# Patient Record
Sex: Female | Born: 1955 | Race: White | Hispanic: No | Marital: Single | Smoking: Never smoker
Health system: Southern US, Community
[De-identification: ages and names within clinical notes are randomized; demographics above are authoritative.]

## PROBLEM LIST (undated history)

## (undated) DIAGNOSIS — I639 Cerebral infarction, unspecified: Secondary | ICD-10-CM

## (undated) DIAGNOSIS — M549 Dorsalgia, unspecified: Secondary | ICD-10-CM

## (undated) HISTORY — DX: Cerebral infarction, unspecified: I63.9

## (undated) HISTORY — DX: Dorsalgia, unspecified: M54.9

---

## 2021-07-29 ENCOUNTER — Emergency Department (HOSPITAL_COMMUNITY)
Admission: EM | Admit: 2021-07-29 | Discharge: 2021-07-29 | Disposition: A | Discharge status: Home / Self Care | Payer: 59 | Attending: Student | Admitting: Student

## 2021-07-29 ENCOUNTER — Other Ambulatory Visit: Payer: Self-pay

## 2021-07-29 DIAGNOSIS — Z5321 Procedure and treatment not carried out due to patient leaving prior to being seen by health care provider: Secondary | ICD-10-CM | POA: Diagnosis not present

## 2021-07-29 DIAGNOSIS — R202 Paresthesia of skin: Secondary | ICD-10-CM | POA: Diagnosis present

## 2021-07-29 DIAGNOSIS — R200 Anesthesia of skin: Secondary | ICD-10-CM | POA: Insufficient documentation

## 2021-07-29 NOTE — ED Triage Notes (Signed)
Patient bib [facility] , went out for stroke like symptoms, stroke screen negative, ems says patient has tingling/numbness in back of neck going down left side. Patient reports she thinks she has a brain tumor, says her head feels heavy. Symptoms x 2 weeks

## 2021-07-30 ENCOUNTER — Ambulatory Visit
Admission: RE | Admit: 2021-07-30 | Discharge: 2021-07-30 | Disposition: A | Discharge status: Home / Self Care | Payer: 59 | Source: Ambulatory Visit | Attending: Family Medicine | Admitting: Family Medicine

## 2021-07-30 ENCOUNTER — Other Ambulatory Visit: Payer: Self-pay | Admitting: Family Medicine

## 2021-07-30 DIAGNOSIS — R200 Anesthesia of skin: Secondary | ICD-10-CM

## 2021-07-30 DIAGNOSIS — R202 Paresthesia of skin: Secondary | ICD-10-CM

## 2022-06-26 DIAGNOSIS — E1169 Type 2 diabetes mellitus with other specified complication: Secondary | ICD-10-CM | POA: Diagnosis not present

## 2022-06-26 DIAGNOSIS — G629 Polyneuropathy, unspecified: Secondary | ICD-10-CM | POA: Diagnosis not present

## 2022-06-26 DIAGNOSIS — E782 Mixed hyperlipidemia: Secondary | ICD-10-CM | POA: Diagnosis not present

## 2022-06-26 DIAGNOSIS — I10 Essential (primary) hypertension: Secondary | ICD-10-CM | POA: Diagnosis not present

## 2022-07-17 DIAGNOSIS — E782 Mixed hyperlipidemia: Secondary | ICD-10-CM | POA: Diagnosis not present

## 2022-07-17 DIAGNOSIS — G629 Polyneuropathy, unspecified: Secondary | ICD-10-CM | POA: Diagnosis not present

## 2022-07-17 DIAGNOSIS — I10 Essential (primary) hypertension: Secondary | ICD-10-CM | POA: Diagnosis not present

## 2022-07-17 DIAGNOSIS — E1169 Type 2 diabetes mellitus with other specified complication: Secondary | ICD-10-CM | POA: Diagnosis not present

## 2022-07-17 DIAGNOSIS — R5383 Other fatigue: Secondary | ICD-10-CM | POA: Diagnosis not present

## 2022-07-24 ENCOUNTER — Telehealth: Payer: Self-pay | Admitting: Hematology

## 2022-07-24 DIAGNOSIS — E782 Mixed hyperlipidemia: Secondary | ICD-10-CM | POA: Diagnosis not present

## 2022-07-24 DIAGNOSIS — M898X9 Other specified disorders of bone, unspecified site: Secondary | ICD-10-CM | POA: Diagnosis not present

## 2022-07-24 DIAGNOSIS — G629 Polyneuropathy, unspecified: Secondary | ICD-10-CM | POA: Diagnosis not present

## 2022-07-24 DIAGNOSIS — E1169 Type 2 diabetes mellitus with other specified complication: Secondary | ICD-10-CM | POA: Diagnosis not present

## 2022-07-24 DIAGNOSIS — D72819 Decreased white blood cell count, unspecified: Secondary | ICD-10-CM | POA: Diagnosis not present

## 2022-07-24 DIAGNOSIS — M549 Dorsalgia, unspecified: Secondary | ICD-10-CM | POA: Diagnosis not present

## 2022-07-24 DIAGNOSIS — I10 Essential (primary) hypertension: Secondary | ICD-10-CM | POA: Diagnosis not present

## 2022-07-24 NOTE — Telephone Encounter (Signed)
Scheduled appointment per 11/01. Patient is aware of appointment date and time. Patient is aware to arrive 15 mins prior to appointment time and to bring updated insurance cards. Patient is aware of location.   

## 2022-07-31 ENCOUNTER — Inpatient Hospital Stay: Payer: Medicare HMO | Attending: Hematology | Admitting: Hematology

## 2022-07-31 ENCOUNTER — Other Ambulatory Visit: Payer: Self-pay

## 2022-07-31 ENCOUNTER — Inpatient Hospital Stay: Payer: Medicare HMO

## 2022-07-31 VITALS — BP 105/65 | HR 69 | Temp 97.7°F | Resp 16 | Wt 160.0 lb

## 2022-07-31 DIAGNOSIS — M549 Dorsalgia, unspecified: Secondary | ICD-10-CM | POA: Diagnosis not present

## 2022-07-31 DIAGNOSIS — D72819 Decreased white blood cell count, unspecified: Secondary | ICD-10-CM | POA: Diagnosis not present

## 2022-07-31 DIAGNOSIS — G629 Polyneuropathy, unspecified: Secondary | ICD-10-CM | POA: Insufficient documentation

## 2022-07-31 DIAGNOSIS — D472 Monoclonal gammopathy: Secondary | ICD-10-CM | POA: Diagnosis not present

## 2022-07-31 DIAGNOSIS — E119 Type 2 diabetes mellitus without complications: Secondary | ICD-10-CM | POA: Diagnosis not present

## 2022-07-31 LAB — CBC WITH DIFFERENTIAL (CANCER CENTER ONLY)
Abs Immature Granulocytes: 0.01 10*3/uL (ref 0.00–0.07)
Basophils Absolute: 0 10*3/uL (ref 0.0–0.1)
Basophils Relative: 0 %
Eosinophils Absolute: 0.3 10*3/uL (ref 0.0–0.5)
Eosinophils Relative: 4 %
HCT: 37.6 % (ref 36.0–46.0)
Hemoglobin: 12.5 g/dL (ref 12.0–15.0)
Immature Granulocytes: 0 %
Lymphocytes Relative: 27 %
Lymphs Abs: 1.9 10*3/uL (ref 0.7–4.0)
MCH: 31.5 pg (ref 26.0–34.0)
MCHC: 33.2 g/dL (ref 30.0–36.0)
MCV: 94.7 fL (ref 80.0–100.0)
Monocytes Absolute: 0.5 10*3/uL (ref 0.1–1.0)
Monocytes Relative: 7 %
Neutro Abs: 4.4 10*3/uL (ref 1.7–7.7)
Neutrophils Relative %: 62 %
Platelet Count: 174 10*3/uL (ref 150–400)
RBC: 3.97 MIL/uL (ref 3.87–5.11)
RDW: 12.7 % (ref 11.5–15.5)
WBC Count: 7 10*3/uL (ref 4.0–10.5)
nRBC: 0 % (ref 0.0–0.2)

## 2022-07-31 LAB — CMP (CANCER CENTER ONLY)
ALT: 33 U/L (ref 0–44)
AST: 26 U/L (ref 15–41)
Albumin: 3.8 g/dL (ref 3.5–5.0)
Alkaline Phosphatase: 47 U/L (ref 38–126)
Anion gap: 6 (ref 5–15)
BUN: 27 mg/dL — ABNORMAL HIGH (ref 8–23)
CO2: 29 mmol/L (ref 22–32)
Calcium: 9 mg/dL (ref 8.9–10.3)
Chloride: 106 mmol/L (ref 98–111)
Creatinine: 1.07 mg/dL — ABNORMAL HIGH (ref 0.44–1.00)
GFR, Estimated: 58 mL/min — ABNORMAL LOW (ref >60)
Glucose, Bld: 155 mg/dL — ABNORMAL HIGH (ref 70–99)
Potassium: 4.3 mmol/L (ref 3.5–5.1)
Sodium: 141 mmol/L (ref 135–145)
Total Bilirubin: 0.6 mg/dL (ref 0.3–1.2)
Total Protein: 6.2 g/dL — ABNORMAL LOW (ref 6.5–8.1)

## 2022-07-31 LAB — LACTATE DEHYDROGENASE: LDH: 192 U/L (ref 98–192)

## 2022-07-31 LAB — TECHNOLOGIST SMEAR REVIEW: Plt Morphology: NORMAL

## 2022-07-31 LAB — VITAMIN B12: Vitamin B-12: 1351 pg/mL — ABNORMAL HIGH (ref 180–914)

## 2022-07-31 NOTE — Progress Notes (Signed)
HEMATOLOGY/ONCOLOGY CONSULTATION NOTE  Date of Service: 07/31/2022  Patient Care Team: Joya Gaskins, FNP as PCP - General (Family Medicine)  CHIEF COMPLAINTS/PURPOSE OF CONSULTATION:  Leukopenia  HISTORY OF PRESENTING ILLNESS:   Misty Stewart is a wonderful 66 y.o. female who has been referred to [place] by Dr. Emilee Hero, MD, for evaluation and management of Leukopenia. Patient is here with her sister during today's visit.   Her lab results from July showed white count of 6K, and it has not been reduced to 2.4 K over the period of about 4 months.   She notes she was diagnosed with Type II Diabetes in February 2022. She was also overweight at the time she was diagnosed with Type II Diabetes. She notes she has lost around 125 lbs since February 2022 because she changed her diet and without taking any weight loss medication.  She reports she had loss of appetite and leg swelling, where her PCP changed her Lisinopril to 10 mg. She complains of consistent dizziness.   She reports she has neck and left hand/leg numbness and sees her Neurologist for it. She notes her neurologist told her that she had mini strokes. Her Neurologist is Dr. Tommas Olp.  She complains of tingling sensation all over the body, mostly on the right side of the body. She has not seen her Neurologist for it, but has scheduled an appointment with her Neurologist.   She notes she recently started Meloxicam a week ago for her back pain. She reports her back pain started about a year ago. She complains that her back has been stiff. She notes that her back pain radiated to numbness/tingling sensation on her left ear and left head. She saw Neurologist for this symptoms and her Neurologist states this symptoms are due to stroke.   She complains loss of sensation/perception on her left side of her face, which is probably due to her stroke.   She notes that her appetite has been well, but she does not know  when she is full. She also reports mild bladder problems.   Patient reports she has loss of sensation on her feet, which she has been going to neuropathy exercises.   She has been taking Cymbalta for her neuropathy as well. She denies taking Gabapentin. Patient notes she is drinking supplemental drink to help her nerve function. She is also taking vitamin B-12 supplement.   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 Ashby Dawes. Her sister notes that she had an back X-Ray last week, but not bone density.   She denies of infections, fever, chill, recent falls, new lumps/bumps, unexpected weight loss, and abdominal pain during today's visit. She complains of middle back pain which radiates to upper back and shoulder. She also complains of problems with her balance, but she does not know if she has had nerve conduction study.    MEDICAL HISTORY:  HTN, DM2 HLD CVA   SURGICAL HISTORY: No previous Shx  SOCIAL HISTORY: Social History   Socioeconomic History   Marital status: Single    Spouse name: Not on file   Number of children: Not on file   Years of education: Not on file   Highest education level: Not on file  Occupational History   Not on file  Tobacco Use   Smoking status: Not on file   Smokeless tobacco: Not on file  Substance and Sexual Activity   Alcohol use: Not on file   Drug use:  Not on file   Sexual activity: Not on file  Other Topics Concern   Not on file  Social History Narrative   Not on file   Social Determinants of Health   Financial Resource Strain: Not on file  Food Insecurity: Not on file  Transportation Needs: Not on file  Physical Activity: Not on file  Stress: Not on file  Social Connections: Not on file  Intimate Partner Violence: Not on file    FAMILY HISTORY:   ALLERGIES:  has no allergies on file.  MEDICATIONS:    REVIEW OF SYSTEMS:    10 Point review of Systems was done is negative except as noted  above.  PHYSICAL EXAMINATION: ECOG PERFORMANCE STATUS: 1 - Symptomatic but completely ambulatory  . Vitals:   07/31/22 1115  BP: 105/65  Pulse: 69  Resp: 16  Temp: 97.7 F (36.5 C)  SpO2: 98%   Filed Weights   07/31/22 1115  Weight: 160 lb (72.6 kg)   .There is no height or weight on file to calculate BMI.  GENERAL:alert, in no acute distress and comfortable SKIN: no acute rashes, no significant lesions EYES: conjunctiva are pink and non-injected, sclera anicteric OROPHARYNX: MMM, no exudates, no oropharyngeal erythema or ulceration NECK: supple, no JVD LYMPH:  no palpable lymphadenopathy in the cervical, axillary or inguinal regions LUNGS: clear to auscultation b/l with normal respiratory effort HEART: regular rate & rhythm ABDOMEN:  normoactive bowel sounds , non tender, not distended. Extremity: no pedal edema PSYCH: alert & oriented x 3 with fluent speech NEURO: no focal motor/sensory deficits  LABORATORY DATA:  I have reviewed the data as listed      RADIOGRAPHIC STUDIES: I have personally reviewed the radiological images as listed and agreed with the findings in the report. No results found.  ASSESSMENT & PLAN:   #1 Neuropathy  Seeing Neurologist for her Neuropathy. #2 Type II Diabetes  #3 Maybe Leukopenia - waiting for lab results form today (07/31/2022)  PLAN: -Discussed what could cause Neuropathy.  -Discussed the causes of low WBC and discussed information about Leukopenia.  -Recommended B-12 Complex.  -Answered all patient and patient's sister questions about Leukopenia, Neuropathy, and other questions.  -Will have lab done during today's visit.   . Orders Placed This Encounter  Procedures   CBC with Differential ([place] Only)    Standing Status:   Future    Number of Occurrences:   1    Standing Expiration Date:   08/01/2023   CMP ([place] only)    Standing Status:   Future    Number of Occurrences:   1    Standing  Expiration Date:   08/01/2023   Lactate dehydrogenase    Standing Status:   Future    Number of Occurrences:   1    Standing Expiration Date:   08/01/2023   Vitamin B12    Standing Status:   Future    Number of Occurrences:   1    Standing Expiration Date:   07/31/2023   Copper, serum    Standing Status:   Future    Number of Occurrences:   1    Standing Expiration Date:   07/31/2023   Multiple Myeloma Panel (SPEP&IFE w/QIG)    Standing Status:   Future    Number of Occurrences:   1    Standing Expiration Date:   08/01/2023   Kappa/lambda light chains    Standing Status:   Future    Number  of Occurrences:   1    Standing Expiration Date:   08/01/2023   Folate RBC    Standing Status:   Future    Number of Occurrences:   1    Standing Expiration Date:   08/01/2023   Pathologist smear review    Standing Status:   Future    Standing Expiration Date:   08/01/2023   Technologist smear review    FOLLOW-UP: Labs today Phone visit with Dr Irene Limbo in 1 week ..gk . Orders Placed This Encounter  Procedures   CBC with Differential ([place] Only)    Standing Status:   Future    Number of Occurrences:   1    Standing Expiration Date:   08/01/2023   CMP ([place] only)    Standing Status:   Future    Number of Occurrences:   1    Standing Expiration Date:   08/01/2023   Lactate dehydrogenase    Standing Status:   Future    Number of Occurrences:   1    Standing Expiration Date:   08/01/2023   Vitamin B12    Standing Status:   Future    Number of Occurrences:   1    Standing Expiration Date:   07/31/2023   Copper, serum    Standing Status:   Future    Number of Occurrences:   1    Standing Expiration Date:   07/31/2023   Multiple Myeloma Panel (SPEP&IFE w/QIG)    Standing Status:   Future    Number of Occurrences:   1    Standing Expiration Date:   08/01/2023   Kappa/lambda light chains    Standing Status:   Future    Number of Occurrences:   1    Standing Expiration Date:    08/01/2023   Folate RBC    Standing Status:   Future    Number of Occurrences:   1    Standing Expiration Date:   08/01/2023   Pathologist smear review    Standing Status:   Future    Standing Expiration Date:   08/01/2023   Technologist smear review    .The total time spent in the appointment was 45 minutes* .  All of the patient's questions were answered with apparent satisfaction. The patient knows to call the clinic with any problems, questions or concerns.   Sullivan Lone MD MS AAHIVMS [facility] Hematology/Oncology Physician [facility]  .*Total Encounter Time as defined by the Centers for Medicare and Medicaid Services includes, in addition to the face-to-face time of a patient visit (documented in the note above) non-face-to-face time: obtaining and reviewing outside history, ordering and reviewing medications, tests or procedures, care coordination (communications with other health care professionals or caregivers) and documentation in the medical record.    Zettie Cooley, am acting as a scribe for Sullivan Lone, MD.  Sullivan Lone MD [place] AAHIVMS [facility] Hematology/Oncology Physician [facility]  (Office):       [phone] (Work cell):  [phone] (Fax):           [phone]  07/31/2022 10:51 AM

## 2022-08-01 LAB — KAPPA/LAMBDA LIGHT CHAINS
Kappa free light chain: 22.1 mg/L — ABNORMAL HIGH (ref 3.3–19.4)
Kappa, lambda light chain ratio: 1.64 (ref 0.26–1.65)
Lambda free light chains: 13.5 mg/L (ref 5.7–26.3)

## 2022-08-02 LAB — FOLATE RBC
Folate, Hemolysate: 350 ng/mL
Folate, RBC: 919 ng/mL (ref >498)
Hematocrit: 38.1 % (ref 34.0–46.6)

## 2022-08-05 LAB — MULTIPLE MYELOMA PANEL, SERUM
Albumin SerPl Elph-Mcnc: 3.4 g/dL (ref 2.9–4.4)
Albumin/Glob SerPl: 1.5 (ref 0.7–1.7)
Alpha 1: 0.2 g/dL (ref 0.0–0.4)
Alpha2 Glob SerPl Elph-Mcnc: 0.5 g/dL (ref 0.4–1.0)
B-Globulin SerPl Elph-Mcnc: 0.9 g/dL (ref 0.7–1.3)
Gamma Glob SerPl Elph-Mcnc: 0.7 g/dL (ref 0.4–1.8)
Globulin, Total: 2.3 g/dL (ref 2.2–3.9)
IgA: 65 mg/dL — ABNORMAL LOW (ref 87–352)
IgG (Immunoglobin G), Serum: 783 mg/dL (ref 586–1602)
IgM (Immunoglobulin M), Srm: 71 mg/dL (ref 26–217)
Total Protein ELP: 5.7 g/dL — ABNORMAL LOW (ref 6.0–8.5)

## 2022-08-07 ENCOUNTER — Inpatient Hospital Stay (HOSPITAL_BASED_OUTPATIENT_CLINIC_OR_DEPARTMENT_OTHER): Payer: Medicare HMO | Admitting: Hematology

## 2022-08-07 DIAGNOSIS — D72819 Decreased white blood cell count, unspecified: Secondary | ICD-10-CM | POA: Diagnosis not present

## 2022-08-07 DIAGNOSIS — M549 Dorsalgia, unspecified: Secondary | ICD-10-CM | POA: Diagnosis not present

## 2022-08-07 DIAGNOSIS — E119 Type 2 diabetes mellitus without complications: Secondary | ICD-10-CM | POA: Diagnosis not present

## 2022-08-07 DIAGNOSIS — G629 Polyneuropathy, unspecified: Secondary | ICD-10-CM | POA: Diagnosis not present

## 2022-08-07 DIAGNOSIS — D472 Monoclonal gammopathy: Secondary | ICD-10-CM | POA: Diagnosis not present

## 2022-08-07 NOTE — Progress Notes (Signed)
HEMATOLOGY/ONCOLOGY Phone visit NOTE  Date of Service: 08/07/2022  Patient Care Team: Joya Gaskins, FNP as PCP - General (Family Medicine)  CHIEF COMPLAINTS/PURPOSE OF CONSULTATION:  Leukopenia  HISTORY OF PRESENTING ILLNESS:   Misty Stewart is a wonderful 66 y.o. female who has been referred to [place] by Dr. Emilee Hero, MD, for evaluation and management of Leukopenia. Patient is here with her sister during today's visit.   Her lab results from July showed white count of 6K, and it has not been reduced to 2.4 K over the period of about 4 months.   She notes she was diagnosed with Type II Diabetes in February 2022. She was also overweight at the time she was diagnosed with Type II Diabetes. She notes she has lost around 125 lbs since February 2022 because she changed her diet and without taking any weight loss medication.  She reports she had loss of appetite and leg swelling, where her PCP changed her Lisinopril to 10 mg. She complains of consistent dizziness.   She reports she has neck and left hand/leg numbness and sees her Neurologist for it. She notes her neurologist told her that she had mini strokes. Her Neurologist is Dr. Tommas Olp.  She complains of tingling sensation all over the body, mostly on the right side of the body. She has not seen her Neurologist for it, but has scheduled an appointment with her Neurologist.   She notes she recently started Meloxicam a week ago for her back pain. She reports her back pain started about a year ago. She complains that her back has been stiff. She notes that her back pain radiated to numbness/tingling sensation on her left ear and left head. She saw Neurologist for this symptoms and her Neurologist states this symptoms are due to stroke.   She complains loss of sensation/perception on her left side of her face, which is probably due to her stroke.   She notes that her appetite has been well, but she does not know  when she is full. She also reports mild bladder problems.   Patient reports she has loss of sensation on her feet, which she has been going to neuropathy exercises.   She has been taking Cymbalta for her neuropathy as well. She denies taking Gabapentin. Patient notes she is drinking supplemental drink to help her nerve function. She is also taking vitamin B-12 supplement.   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 Ashby Dawes. Her sister notes that she had an back X-Ray last week, but not bone density.   She denies of infections, fever, chill, recent falls, new lumps/bumps, unexpected weight loss, and abdominal pain during today's visit. She complains of middle back pain which radiates to upper back and shoulder. She also complains of problems with her balance, but she does not know if she has had nerve conduction study.   Interval History:  Misty Stewart is a wonderful 66 y.o. female who is here for continued evaluation and management of Leukopenia.  .I connected with Misty Stewart on 08/07/2022 at  8:40 AM EST by telephone visit and verified that I am speaking with the correct person using two identifiers.   Patient was last seen by me on 07/31/2022 and complained of nerve problems and middle back pain that radiated to her upper back and shoulder.   Patient notes she is doing well since our last visit. We discussed her lab results from 07/31/2022. Her WBC, Hemoglobin, LDH, and  Platelets counts are in the normal range.   I discussed the limitations, risks, security and privacy concerns of performing an evaluation and management service by telemedicine and the availability of in-person appointments. I also discussed with the patient that there may be a patient responsible charge related to this service. The patient expressed understanding and agreed to proceed.   Other persons participating in the visit and their role in the encounter: None   Patient's location: Home   Provider's location: [facility]   Chief Complaint: Leukopenia    MEDICAL HISTORY:  HTN, DM2 HLD CVA   SURGICAL HISTORY: No previous Shx  SOCIAL HISTORY: Social History   Socioeconomic History   Marital status: Single    Spouse name: Not on file   Number of children: Not on file   Years of education: Not on file   Highest education level: Not on file  Occupational History   Not on file  Tobacco Use   Smoking status: Not on file   Smokeless tobacco: Not on file  Substance and Sexual Activity   Alcohol use: Not on file   Drug use: Not on file   Sexual activity: Not on file  Other Topics Concern   Not on file  Social History Narrative   Not on file   Social Determinants of Health   Financial Resource Strain: Not on file  Food Insecurity: Not on file  Transportation Needs: Not on file  Physical Activity: Not on file  Stress: Not on file  Social Connections: Not on file  Intimate Partner Violence: Not on file    FAMILY HISTORY:   ALLERGIES:  has no allergies on file.  MEDICATIONS:    REVIEW OF SYSTEMS:    10 Point review of Systems was done is negative except as noted above.  PHYSICAL EXAMINATION: ECOG PERFORMANCE STATUS: 1 - Symptomatic but completely ambulatory  . There were no vitals filed for this visit.  There were no vitals filed for this visit.  .There is no height or weight on file to calculate BMI.  GENERAL:alert, in no acute distress and comfortable SKIN: no acute rashes, no significant lesions EYES: conjunctiva are pink and non-injected, sclera anicteric OROPHARYNX: MMM, no exudates, no oropharyngeal erythema or ulceration NECK: supple, no JVD LYMPH:  no palpable lymphadenopathy in the cervical, axillary or inguinal regions LUNGS: clear to auscultation b/l with normal respiratory effort HEART: regular rate & rhythm ABDOMEN:  normoactive bowel sounds , non tender, not distended. Extremity: no pedal edema PSYCH: alert & oriented x 3 with  fluent speech NEURO: no focal motor/sensory deficits  LABORATORY DATA:  I have reviewed the data as listed      RADIOGRAPHIC STUDIES: I have personally reviewed the radiological images as listed and agreed with the findings in the report. No results found.  ASSESSMENT & PLAN:   #1 Neuropathy  Seeing Neurologist for her Neuropathy. #2 Type II Diabetes  #3 Maybe Leukopenia - waiting for lab results form today (07/31/2022)  PLAN: -Discussed lab results from 07/31/2022 which showed she is not leukopenic. Lab results showed CBC and CMP in the normal range. Her LDH is in the normal range.  -Recommended continue to follow up with PCP. -no additional hematologic w/u recommended at this time. . No orders of the defined types were placed in this encounter.   FOLLOW-UP: RTC with PCP  The total time spent in the appointment was 10 minutes* .  All of the patient's questions were answered with apparent satisfaction. The patient  knows to call the clinic with any problems, questions or concerns.  Zettie Cooley, am acting as a scribe for Sullivan Lone, MD.  Sullivan Lone MD [place] AAHIVMS [facility] Hematology/Oncology Physician [facility]  .*Total Encounter Time as defined by the Centers for Medicare and Medicaid Services includes, in addition to the face-to-face time of a patient visit (documented in the note above) non-face-to-face time: obtaining and reviewing outside history, ordering and reviewing medications, tests or procedures, care coordination (communications with other health care professionals or caregivers) and documentation in the medical record.

## 2022-08-08 DIAGNOSIS — E782 Mixed hyperlipidemia: Secondary | ICD-10-CM | POA: Diagnosis not present

## 2022-08-08 DIAGNOSIS — G629 Polyneuropathy, unspecified: Secondary | ICD-10-CM | POA: Diagnosis not present

## 2022-08-08 DIAGNOSIS — M2550 Pain in unspecified joint: Secondary | ICD-10-CM | POA: Diagnosis not present

## 2022-08-08 DIAGNOSIS — I10 Essential (primary) hypertension: Secondary | ICD-10-CM | POA: Diagnosis not present

## 2022-08-10 LAB — COPPER, SERUM: Copper: 102 ug/dL (ref 80–158)

## 2022-08-14 DIAGNOSIS — R200 Anesthesia of skin: Secondary | ICD-10-CM | POA: Diagnosis not present

## 2022-11-04 DIAGNOSIS — E782 Mixed hyperlipidemia: Secondary | ICD-10-CM | POA: Diagnosis not present

## 2022-11-04 DIAGNOSIS — I10 Essential (primary) hypertension: Secondary | ICD-10-CM | POA: Diagnosis not present

## 2022-11-05 DIAGNOSIS — R200 Anesthesia of skin: Secondary | ICD-10-CM | POA: Diagnosis not present

## 2022-11-08 DIAGNOSIS — E782 Mixed hyperlipidemia: Secondary | ICD-10-CM | POA: Diagnosis not present

## 2022-11-08 DIAGNOSIS — E1169 Type 2 diabetes mellitus with other specified complication: Secondary | ICD-10-CM | POA: Diagnosis not present

## 2022-11-08 DIAGNOSIS — I10 Essential (primary) hypertension: Secondary | ICD-10-CM | POA: Diagnosis not present

## 2022-11-08 DIAGNOSIS — G629 Polyneuropathy, unspecified: Secondary | ICD-10-CM | POA: Diagnosis not present

## 2022-11-19 DIAGNOSIS — Z1330 Encounter for screening examination for mental health and behavioral disorders, unspecified: Secondary | ICD-10-CM | POA: Diagnosis not present

## 2022-11-19 DIAGNOSIS — R202 Paresthesia of skin: Secondary | ICD-10-CM | POA: Diagnosis not present

## 2022-12-03 DIAGNOSIS — M5187 Other intervertebral disc disorders, lumbosacral region: Secondary | ICD-10-CM | POA: Diagnosis not present

## 2022-12-03 DIAGNOSIS — M47817 Spondylosis without myelopathy or radiculopathy, lumbosacral region: Secondary | ICD-10-CM | POA: Diagnosis not present

## 2022-12-03 DIAGNOSIS — M5186 Other intervertebral disc disorders, lumbar region: Secondary | ICD-10-CM | POA: Diagnosis not present

## 2022-12-03 DIAGNOSIS — M47812 Spondylosis without myelopathy or radiculopathy, cervical region: Secondary | ICD-10-CM | POA: Diagnosis not present

## 2022-12-03 DIAGNOSIS — M47816 Spondylosis without myelopathy or radiculopathy, lumbar region: Secondary | ICD-10-CM | POA: Diagnosis not present

## 2022-12-03 DIAGNOSIS — R202 Paresthesia of skin: Secondary | ICD-10-CM | POA: Diagnosis not present

## 2023-02-19 DIAGNOSIS — M25562 Pain in left knee: Secondary | ICD-10-CM | POA: Diagnosis not present

## 2023-02-19 DIAGNOSIS — M25561 Pain in right knee: Secondary | ICD-10-CM | POA: Diagnosis not present

## 2023-02-19 DIAGNOSIS — G2581 Restless legs syndrome: Secondary | ICD-10-CM | POA: Diagnosis not present

## 2023-04-09 DIAGNOSIS — G629 Polyneuropathy, unspecified: Secondary | ICD-10-CM | POA: Diagnosis not present

## 2023-04-09 DIAGNOSIS — E1169 Type 2 diabetes mellitus with other specified complication: Secondary | ICD-10-CM | POA: Diagnosis not present

## 2023-04-09 DIAGNOSIS — I10 Essential (primary) hypertension: Secondary | ICD-10-CM | POA: Diagnosis not present

## 2023-04-09 DIAGNOSIS — E782 Mixed hyperlipidemia: Secondary | ICD-10-CM | POA: Diagnosis not present

## 2023-04-16 DIAGNOSIS — I10 Essential (primary) hypertension: Secondary | ICD-10-CM | POA: Diagnosis not present

## 2023-04-16 DIAGNOSIS — E1142 Type 2 diabetes mellitus with diabetic polyneuropathy: Secondary | ICD-10-CM | POA: Diagnosis not present

## 2023-04-16 DIAGNOSIS — G629 Polyneuropathy, unspecified: Secondary | ICD-10-CM | POA: Diagnosis not present

## 2023-04-16 DIAGNOSIS — E782 Mixed hyperlipidemia: Secondary | ICD-10-CM | POA: Diagnosis not present

## 2023-05-14 DIAGNOSIS — H5213 Myopia, bilateral: Secondary | ICD-10-CM | POA: Diagnosis not present

## 2023-05-29 DIAGNOSIS — R2681 Unsteadiness on feet: Secondary | ICD-10-CM | POA: Diagnosis not present

## 2023-06-06 ENCOUNTER — Emergency Department (HOSPITAL_BASED_OUTPATIENT_CLINIC_OR_DEPARTMENT_OTHER)
Admission: EM | Admit: 2023-06-06 | Discharge: 2023-06-06 | Disposition: A | Discharge status: Home / Self Care | Payer: Medicare HMO | Attending: Emergency Medicine | Admitting: Emergency Medicine

## 2023-06-06 ENCOUNTER — Encounter (HOSPITAL_BASED_OUTPATIENT_CLINIC_OR_DEPARTMENT_OTHER): Payer: Self-pay | Admitting: Emergency Medicine

## 2023-06-06 ENCOUNTER — Emergency Department (HOSPITAL_BASED_OUTPATIENT_CLINIC_OR_DEPARTMENT_OTHER): Payer: Medicare HMO

## 2023-06-06 DIAGNOSIS — Z7982 Long term (current) use of aspirin: Secondary | ICD-10-CM | POA: Insufficient documentation

## 2023-06-06 DIAGNOSIS — R109 Unspecified abdominal pain: Secondary | ICD-10-CM

## 2023-06-06 DIAGNOSIS — R200 Anesthesia of skin: Secondary | ICD-10-CM | POA: Diagnosis not present

## 2023-06-06 DIAGNOSIS — M48061 Spinal stenosis, lumbar region without neurogenic claudication: Secondary | ICD-10-CM | POA: Diagnosis not present

## 2023-06-06 DIAGNOSIS — N200 Calculus of kidney: Secondary | ICD-10-CM | POA: Diagnosis not present

## 2023-06-06 DIAGNOSIS — K449 Diaphragmatic hernia without obstruction or gangrene: Secondary | ICD-10-CM | POA: Diagnosis not present

## 2023-06-06 DIAGNOSIS — M47816 Spondylosis without myelopathy or radiculopathy, lumbar region: Secondary | ICD-10-CM | POA: Diagnosis not present

## 2023-06-06 DIAGNOSIS — M5136 Other intervertebral disc degeneration, lumbar region: Secondary | ICD-10-CM

## 2023-06-06 DIAGNOSIS — R42 Dizziness and giddiness: Secondary | ICD-10-CM | POA: Insufficient documentation

## 2023-06-06 DIAGNOSIS — R1032 Left lower quadrant pain: Secondary | ICD-10-CM | POA: Insufficient documentation

## 2023-06-06 DIAGNOSIS — M5135 Other intervertebral disc degeneration, thoracolumbar region: Secondary | ICD-10-CM | POA: Diagnosis not present

## 2023-06-06 LAB — CBC
HCT: 37.8 % (ref 36.0–46.0)
Hemoglobin: 12.9 g/dL (ref 12.0–15.0)
MCH: 31.2 pg (ref 26.0–34.0)
MCHC: 34.1 g/dL (ref 30.0–36.0)
MCV: 91.3 fL (ref 80.0–100.0)
Platelets: 223 10*3/uL (ref 150–400)
RBC: 4.14 MIL/uL (ref 3.87–5.11)
RDW: 13.2 % (ref 11.5–15.5)
WBC: 8.9 10*3/uL (ref 4.0–10.5)
nRBC: 0 % (ref 0.0–0.2)

## 2023-06-06 LAB — URINALYSIS, ROUTINE W REFLEX MICROSCOPIC
Bilirubin Urine: NEGATIVE
Glucose, UA: NEGATIVE mg/dL
Hgb urine dipstick: NEGATIVE
Ketones, ur: NEGATIVE mg/dL
Leukocytes,Ua: NEGATIVE
Nitrite: NEGATIVE
Protein, ur: NEGATIVE mg/dL
Specific Gravity, Urine: 1.009 (ref 1.005–1.030)
pH: 7 (ref 5.0–8.0)

## 2023-06-06 LAB — COMPREHENSIVE METABOLIC PANEL
ALT: 12 U/L (ref 0–44)
AST: 14 U/L — ABNORMAL LOW (ref 15–41)
Albumin: 3.7 g/dL (ref 3.5–5.0)
Alkaline Phosphatase: 56 U/L (ref 38–126)
Anion gap: 12 (ref 5–15)
BUN: 13 mg/dL (ref 8–23)
CO2: 19 mmol/L — ABNORMAL LOW (ref 22–32)
Calcium: 8.7 mg/dL — ABNORMAL LOW (ref 8.9–10.3)
Chloride: 106 mmol/L (ref 98–111)
Creatinine, Ser: 0.93 mg/dL (ref 0.44–1.00)
GFR, Estimated: >60 mL/min (ref >60)
Glucose, Bld: 93 mg/dL (ref 70–99)
Potassium: 3.7 mmol/L (ref 3.5–5.1)
Sodium: 137 mmol/L (ref 135–145)
Total Bilirubin: 0.9 mg/dL (ref 0.3–1.2)
Total Protein: 6.2 g/dL — ABNORMAL LOW (ref 6.5–8.1)

## 2023-06-06 LAB — LIPASE, BLOOD: Lipase: 21 U/L (ref 11–51)

## 2023-06-06 MED ORDER — IOHEXOL 300 MG/ML  SOLN
100.0000 mL | Freq: Once | INTRAMUSCULAR | Status: AC | PRN
  Administered 2023-06-06: 100 mL via INTRAVENOUS

## 2023-06-06 MED ORDER — TRAMADOL HCL 50 MG PO TABS
50.0000 mg | ORAL_TABLET | Freq: Four times a day (QID) | ORAL | 0 refills | Status: AC | PRN
Start: 2023-06-06 — End: ?

## 2023-06-06 NOTE — ED Triage Notes (Signed)
Left flank pain x 3 day worse today Reports feeling swelling in abdomen

## 2023-06-06 NOTE — Discharge Instructions (Addendum)
It was our pleasure to provide your ER care today - we hope that you feel better.  No intra-abdominal cause of your pain was noted on your imaging studies. Note was made of lumbar degenerative disc disease - it is possible that this, and/or related nerve impingement is contributing to your pain.  Incidental note was made on your imaging of a kidney stone in the right kidney.   Take acetaminophen or ibuprofen as need for pain. You may also take ultram as need for pain - no driving when taking.   Follow up closely with primary care doctor in the coming week.  Return to ER if worse, new symptoms, fevers, severe/intractable pain, numbness/weakness, or other concern.

## 2023-06-06 NOTE — ED Provider Notes (Signed)
[place] [facility]ENC[facility] AT [facility] Provider Note   CSN: [id number] Arrival date & time: 06/06/23  1347     History  Chief Complaint  Patient presents with   Flank Pain    Misty Stewart is a 67 y.o. female.  She has a history of stroke which is left her with some left-sided deficits and gait instability.  She said about 2 weeks ago she had a significant dizzy spell and she has had worsening numbness and worsening gait instability.  It was associated with some nausea and vomiting at that time.  She saw her neurologist about a week ago and they felt she may have had another stroke.  She is waiting on an MRI of her head and her lumbar spine.  For the last 2 days she has had pain in her left flank radiating around to her left lower quadrant.  It is worse with movement.  She denies any fever chills nausea vomiting diarrhea or urinary symptoms.  She is not on any blood thinners.  The history is provided by the patient.  Flank Pain This is a new problem. The current episode started 2 days ago. The problem occurs constantly. The problem has not changed since onset.Associated symptoms include abdominal pain. Pertinent negatives include no chest pain, no headaches and no shortness of breath. The symptoms are aggravated by bending and twisting. Nothing relieves the symptoms. She has tried rest for the symptoms. The treatment provided no relief.       Home Medications Prior to Admission medications   Medication Sig Start Date End Date Taking? Authorizing Provider  aspirin 325 MG tablet 325 mg. 02/05/22   [provider]  cyanocobalamin (VITAMIN B12) 1000 MCG tablet 1,000 mcg daily. 02/05/22   [provider]  CYMBALTA 30 MG capsule Take 30 mg by mouth daily. 06/05/22   [provider]  LIPITOR 40 MG tablet Take 40 mg by mouth daily. 03/19/22   [provider]  lisinopril (ZESTRIL) 10 MG tablet Take 10 mg by mouth daily. 01/04/22   [provider]  meloxicam (MOBIC) 15 MG tablet Take 15 mg by mouth daily. 07/24/22   [provider]      Allergies    Patient has no known allergies.    Review of Systems   Review of Systems  Constitutional:  Negative for fever.  Respiratory:  Negative for shortness of breath.   Cardiovascular:  Negative for chest pain.  Gastrointestinal:  Positive for abdominal pain.  Genitourinary:  Positive for flank pain.  Musculoskeletal:  Positive for back pain and gait problem.  Neurological:  Positive for weakness and numbness. Negative for headaches.    Physical Exam Updated Vital Signs BP 130/80 (BP Location: Right Arm)   Pulse 87   Temp 98.3 F (36.8 C)   Resp 18   SpO2 99%  Physical Exam Vitals and nursing note reviewed.  Constitutional:      General: She is not in acute distress.    Appearance: Normal appearance. She is well-developed.  HENT:     Head: Normocephalic and atraumatic.  Eyes:     Conjunctiva/sclera: Conjunctivae normal.  Cardiovascular:     Rate and Rhythm: Normal rate and regular rhythm.     Heart sounds: No murmur heard. Pulmonary:     Effort: Pulmonary effort is normal. No respiratory distress.     Breath sounds: Normal breath sounds.  Abdominal:     Palpations: Abdomen is soft.  Tenderness: There is no abdominal tenderness. There is no guarding or rebound.  Musculoskeletal:        General: No deformity. Normal range of motion.     Cervical back: Neck supple.  Skin:    General: Skin is warm and dry.     Capillary Refill: Capillary refill takes less than 2 seconds.  Neurological:     Mental Status: She is alert.     Comments: She has no gross facial asymmetry and her upper extremity strength is 5 out of 5.  Lower extremity strength is probably 4 out of 5 bilateral.  She is intact sensation to light touch in her lower extremity.     ED Results / Procedures / Treatments   Labs (all labs ordered are listed, but only abnormal results are  displayed) Labs Reviewed  COMPREHENSIVE METABOLIC PANEL - Abnormal; Notable for the following components:      Result Value   CO2 19 (*)    Calcium 8.7 (*)    Total Protein 6.2 (*)    AST 14 (*)    All other components within normal limits  URINALYSIS, ROUTINE W REFLEX MICROSCOPIC  CBC  LIPASE, BLOOD    EKG None  Radiology No results found.  Procedures Procedures    Medications Ordered in ED Medications  iohexol (OMNIPAQUE) 300 MG/ML solution 100 mL (100 mLs Intravenous Contrast Given 06/06/23 1536)    ED Course/ Medical Decision Making/ A&P                                 Medical Decision Making Amount and/or Complexity of Data Reviewed Labs: ordered. Radiology: ordered.  Risk Prescription drug management.   This patient complains of left flank pain radiating through the left lower abdomen; this involves an extensive number of treatment Options and is a complaint that carries with it a high risk of complications and morbidity. The differential includes renal colic, pyelonephritis, musculoskeletal, retroperitoneal bleed, fracture, diverticulitis  I ordered, reviewed and interpreted labs, which included CBC with normal white count normal hemoglobin, chemistries with low bicarb, urinalysis without signs of infection I ordered imaging studies which included CT head abdomen and pelvis lumbar spine and this is pending at time of signout  additional history obtained from patient's companion Previous records obtained and reviewed in epic including recent neurology notes Cardiac monitoring reviewed, sinus rhythm Social determinants considered, no significant barriers Critical Interventions: None  After the interventions stated above, I reevaluated the patient and found patient is resting comfortably in no distress Admission and further testing considered, her care is signed out to Dr. Denton Lank to follow-up on results of imaging.  Disposition based on results of  imaging.         Final Clinical Impression(s) / ED Diagnoses Final diagnoses:  Left flank pain    Rx / DC Orders ED Discharge Orders     None         Terrilee Files, MD 06/06/23 1702

## 2023-06-06 NOTE — ED Provider Notes (Signed)
Signed out by Dr Charm Barges that ct pending, to d/c to home when imaging  resulted.   Pt c/o is left lower abd/groin area pain. No sts or skin changes/lesions to area of pain. Pain goes from left lower back towards left groin area. No fevers. Vitals normal. Abd soft nt.   CT neg for acute abd process. DDD noted on imaging.   Pt currently appears stable for d/c per Dr Shela Commons plan.  Rec pcp f/u.  Return precautions provided.      Cathren Laine, MD 06/06/23 [phone]

## 2023-06-12 DIAGNOSIS — M5135 Other intervertebral disc degeneration, thoracolumbar region: Secondary | ICD-10-CM | POA: Diagnosis not present

## 2023-06-12 DIAGNOSIS — R110 Nausea: Secondary | ICD-10-CM | POA: Diagnosis not present

## 2023-06-12 DIAGNOSIS — R9082 White matter disease, unspecified: Secondary | ICD-10-CM | POA: Diagnosis not present

## 2023-06-12 DIAGNOSIS — R2681 Unsteadiness on feet: Secondary | ICD-10-CM | POA: Diagnosis not present

## 2023-06-12 DIAGNOSIS — M5136 Other intervertebral disc degeneration, lumbar region: Secondary | ICD-10-CM | POA: Diagnosis not present

## 2023-06-12 DIAGNOSIS — M5127 Other intervertebral disc displacement, lumbosacral region: Secondary | ICD-10-CM | POA: Diagnosis not present

## 2023-06-12 DIAGNOSIS — M5137 Other intervertebral disc degeneration, lumbosacral region: Secondary | ICD-10-CM | POA: Diagnosis not present

## 2023-06-18 DIAGNOSIS — R2681 Unsteadiness on feet: Secondary | ICD-10-CM | POA: Diagnosis not present

## 2023-07-07 ENCOUNTER — Telehealth: Payer: Self-pay

## 2023-07-07 NOTE — Telephone Encounter (Signed)
Transition Care Management Unsuccessful Follow-up Telephone Call  Date of discharge and from where:  06/06/2023 [facility]  Attempts:  1st Attempt  Reason for unsuccessful TCM follow-up call:  No answer/busy  Kamrie Fanton [facility]  [facility], [facility] Guide Direct Dial: [phone]  Website: [email or web address]

## 2023-07-08 DIAGNOSIS — R2681 Unsteadiness on feet: Secondary | ICD-10-CM | POA: Diagnosis not present

## 2023-07-09 ENCOUNTER — Telehealth: Payer: Self-pay

## 2023-07-09 NOTE — Telephone Encounter (Signed)
Transition Care Management Unsuccessful Follow-up Telephone Call  Date of discharge and from where:  06/06/2023 [facility]  Attempts:  2nd Attempt  Reason for unsuccessful TCM follow-up call:  No answer/busy  Sanyla Summey [facility]  [facility], [facility] Guide Direct Dial: [phone]  Website: [email or web address]

## 2023-07-16 DIAGNOSIS — M25512 Pain in left shoulder: Secondary | ICD-10-CM | POA: Diagnosis not present

## 2023-07-16 DIAGNOSIS — R2681 Unsteadiness on feet: Secondary | ICD-10-CM | POA: Diagnosis not present

## 2023-07-16 DIAGNOSIS — M549 Dorsalgia, unspecified: Secondary | ICD-10-CM | POA: Diagnosis not present

## 2023-07-16 DIAGNOSIS — M7582 Other shoulder lesions, left shoulder: Secondary | ICD-10-CM | POA: Diagnosis not present

## 2023-07-16 DIAGNOSIS — M1990 Unspecified osteoarthritis, unspecified site: Secondary | ICD-10-CM | POA: Diagnosis not present

## 2023-07-16 DIAGNOSIS — M2550 Pain in unspecified joint: Secondary | ICD-10-CM | POA: Diagnosis not present

## 2023-07-21 DIAGNOSIS — M810 Age-related osteoporosis without current pathological fracture: Secondary | ICD-10-CM | POA: Diagnosis not present

## 2023-07-22 DIAGNOSIS — R2681 Unsteadiness on feet: Secondary | ICD-10-CM | POA: Diagnosis not present

## 2023-07-24 DIAGNOSIS — R2681 Unsteadiness on feet: Secondary | ICD-10-CM | POA: Diagnosis not present

## 2023-08-05 DIAGNOSIS — R2681 Unsteadiness on feet: Secondary | ICD-10-CM | POA: Diagnosis not present

## 2023-08-07 DIAGNOSIS — R2681 Unsteadiness on feet: Secondary | ICD-10-CM | POA: Diagnosis not present

## 2023-08-12 DIAGNOSIS — R2681 Unsteadiness on feet: Secondary | ICD-10-CM | POA: Diagnosis not present

## 2023-08-13 DIAGNOSIS — I10 Essential (primary) hypertension: Secondary | ICD-10-CM | POA: Diagnosis not present

## 2023-08-13 DIAGNOSIS — E782 Mixed hyperlipidemia: Secondary | ICD-10-CM | POA: Diagnosis not present

## 2023-08-13 DIAGNOSIS — D72819 Decreased white blood cell count, unspecified: Secondary | ICD-10-CM | POA: Diagnosis not present

## 2023-08-13 DIAGNOSIS — R5383 Other fatigue: Secondary | ICD-10-CM | POA: Diagnosis not present

## 2023-08-13 DIAGNOSIS — G629 Polyneuropathy, unspecified: Secondary | ICD-10-CM | POA: Diagnosis not present

## 2023-08-13 DIAGNOSIS — Z0000 Encounter for general adult medical examination without abnormal findings: Secondary | ICD-10-CM | POA: Diagnosis not present

## 2023-08-13 DIAGNOSIS — E1142 Type 2 diabetes mellitus with diabetic polyneuropathy: Secondary | ICD-10-CM | POA: Diagnosis not present

## 2023-08-14 DIAGNOSIS — R2681 Unsteadiness on feet: Secondary | ICD-10-CM | POA: Diagnosis not present

## 2023-08-19 DIAGNOSIS — R2681 Unsteadiness on feet: Secondary | ICD-10-CM | POA: Diagnosis not present

## 2023-08-20 DIAGNOSIS — E782 Mixed hyperlipidemia: Secondary | ICD-10-CM | POA: Diagnosis not present

## 2023-08-20 DIAGNOSIS — Z0000 Encounter for general adult medical examination without abnormal findings: Secondary | ICD-10-CM | POA: Diagnosis not present

## 2023-08-20 DIAGNOSIS — M810 Age-related osteoporosis without current pathological fracture: Secondary | ICD-10-CM | POA: Diagnosis not present

## 2023-08-20 DIAGNOSIS — G629 Polyneuropathy, unspecified: Secondary | ICD-10-CM | POA: Diagnosis not present

## 2023-08-20 DIAGNOSIS — D72819 Decreased white blood cell count, unspecified: Secondary | ICD-10-CM | POA: Diagnosis not present

## 2023-08-20 DIAGNOSIS — I10 Essential (primary) hypertension: Secondary | ICD-10-CM | POA: Diagnosis not present

## 2023-08-20 DIAGNOSIS — N1831 Chronic kidney disease, stage 3a: Secondary | ICD-10-CM | POA: Diagnosis not present

## 2023-08-20 DIAGNOSIS — E1122 Type 2 diabetes mellitus with diabetic chronic kidney disease: Secondary | ICD-10-CM | POA: Diagnosis not present

## 2023-08-20 DIAGNOSIS — M1990 Unspecified osteoarthritis, unspecified site: Secondary | ICD-10-CM | POA: Diagnosis not present

## 2023-08-26 DIAGNOSIS — R2681 Unsteadiness on feet: Secondary | ICD-10-CM | POA: Diagnosis not present

## 2023-08-27 DIAGNOSIS — M4856XA Collapsed vertebra, not elsewhere classified, lumbar region, initial encounter for fracture: Secondary | ICD-10-CM | POA: Diagnosis not present

## 2023-08-27 DIAGNOSIS — M545 Low back pain, unspecified: Secondary | ICD-10-CM | POA: Diagnosis not present

## 2023-08-28 DIAGNOSIS — R2681 Unsteadiness on feet: Secondary | ICD-10-CM | POA: Diagnosis not present

## 2023-09-02 DIAGNOSIS — R2681 Unsteadiness on feet: Secondary | ICD-10-CM | POA: Diagnosis not present

## 2023-09-04 DIAGNOSIS — R2681 Unsteadiness on feet: Secondary | ICD-10-CM | POA: Diagnosis not present

## 2023-09-05 DIAGNOSIS — R202 Paresthesia of skin: Secondary | ICD-10-CM | POA: Diagnosis not present

## 2023-09-09 DIAGNOSIS — R2681 Unsteadiness on feet: Secondary | ICD-10-CM | POA: Diagnosis not present

## 2023-09-11 DIAGNOSIS — R2681 Unsteadiness on feet: Secondary | ICD-10-CM | POA: Diagnosis not present

## 2023-09-15 DIAGNOSIS — R2681 Unsteadiness on feet: Secondary | ICD-10-CM | POA: Diagnosis not present

## 2023-09-17 IMAGING — CT CT HEAD W/O CM
1 series · 16 of 30 positions shown, 20 images · non-contrast
Comparison: None.

CLINICAL DATA: Left arm/leg numbness.  Hypertension.  No trauma

EXAM:
CT HEAD WITHOUT CONTRAST
TECHNIQUE: Contiguous axial images were obtained from the base of the skull
through the vertex without intravenous contrast.

[Series 2: head w/(date) · axial · 0.44mm/px · z∈[-160,-20]mm · 16 of 32 slices shown, 20 images]
[im 2/32  brain]
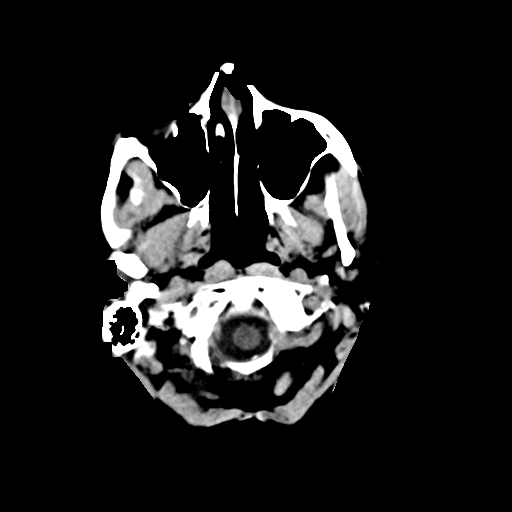
[im 2/32  bone]
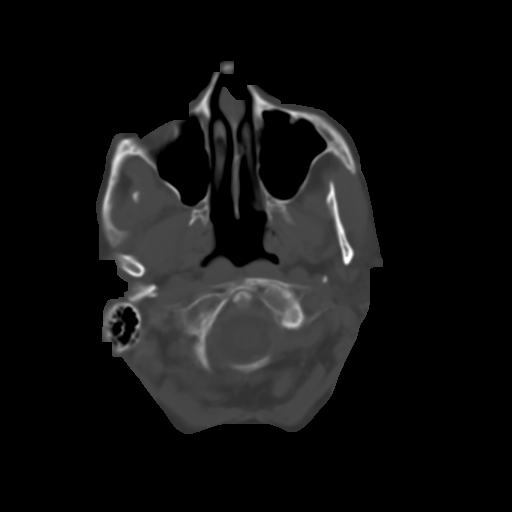
[im 4/32  brain]
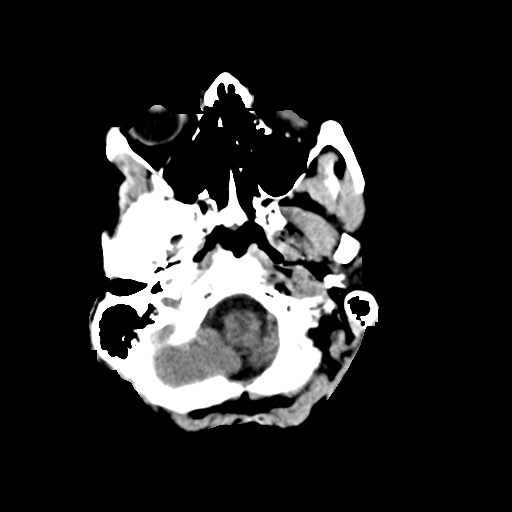
[im 6/32  brain]
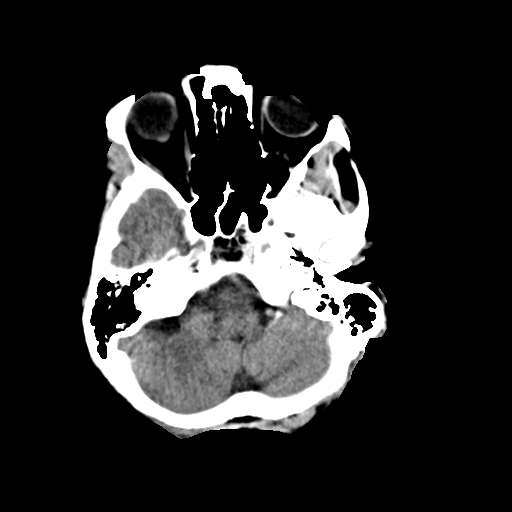
[im 8/32  brain]
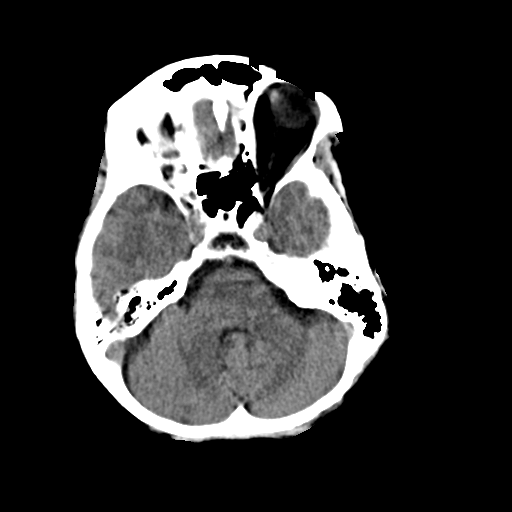
[im 9/32  brain]
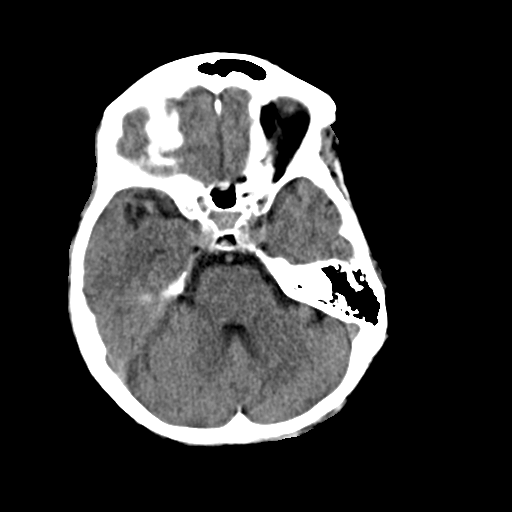
[im 9/32  bone]
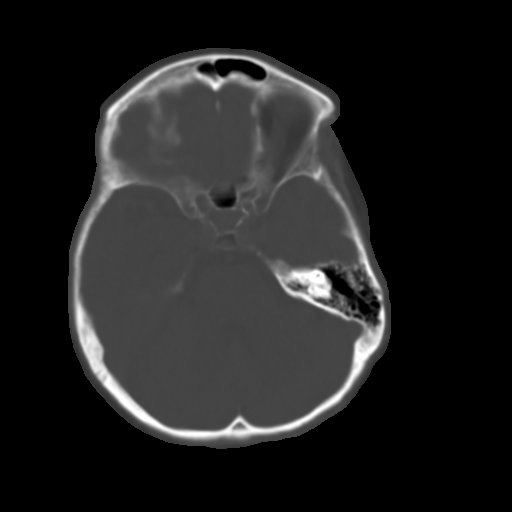
[im 11/32  brain]
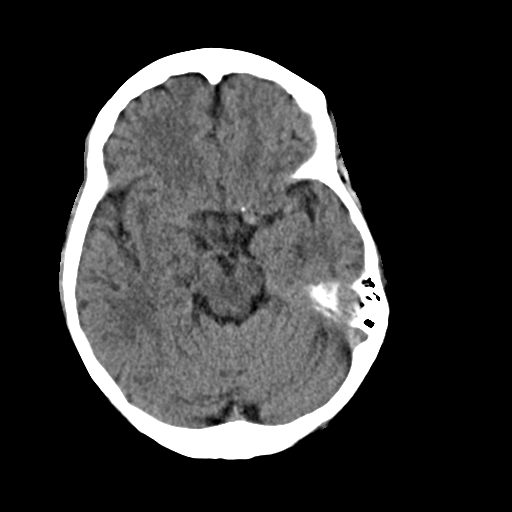
[im 13/32  brain]
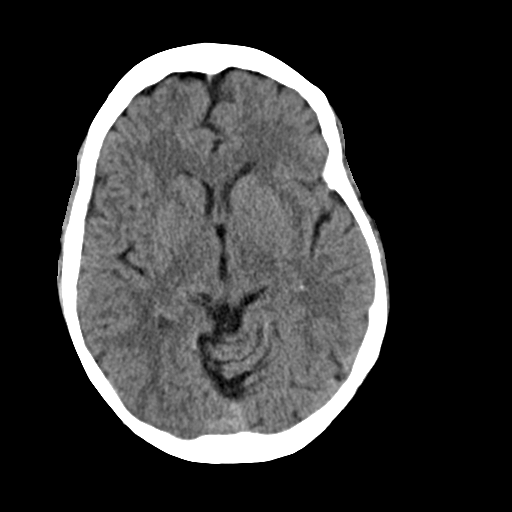
[im 15/32  brain]
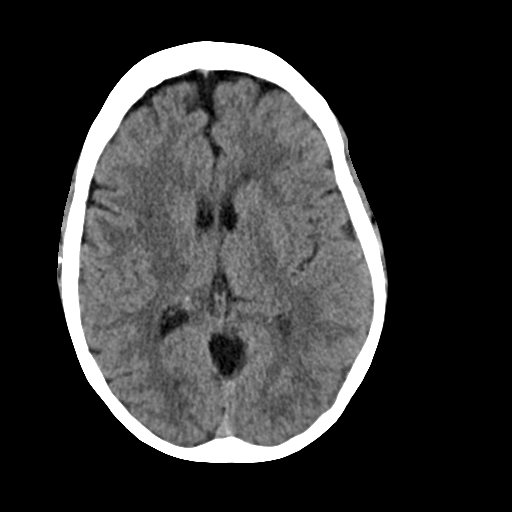
[im 17/32  brain]
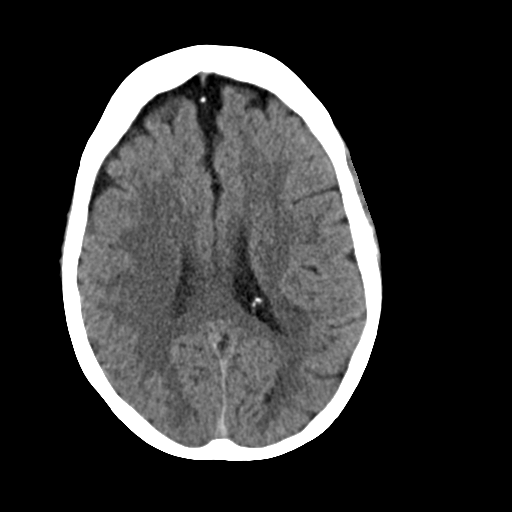
[im 17/32  bone]
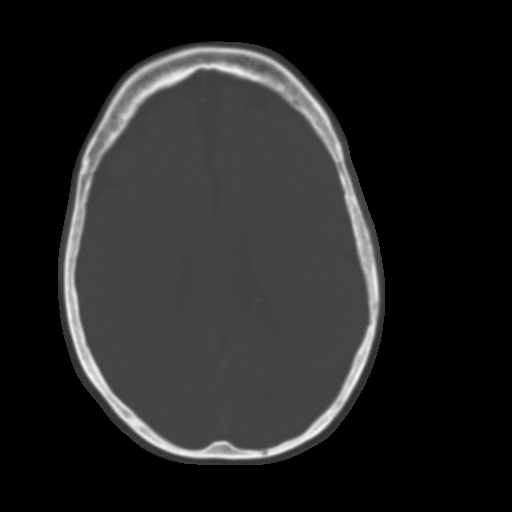
[im 19/32  brain]
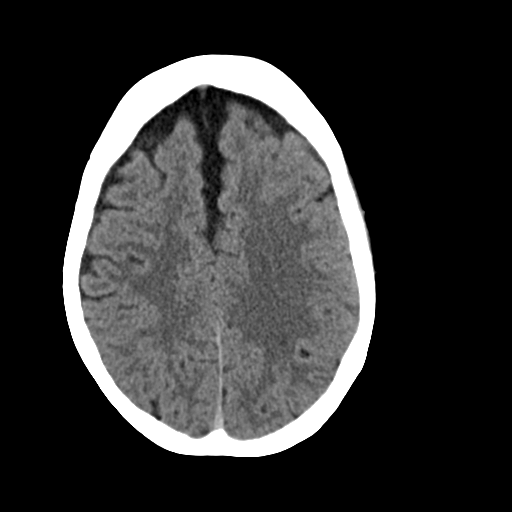
[im 21/32  brain]
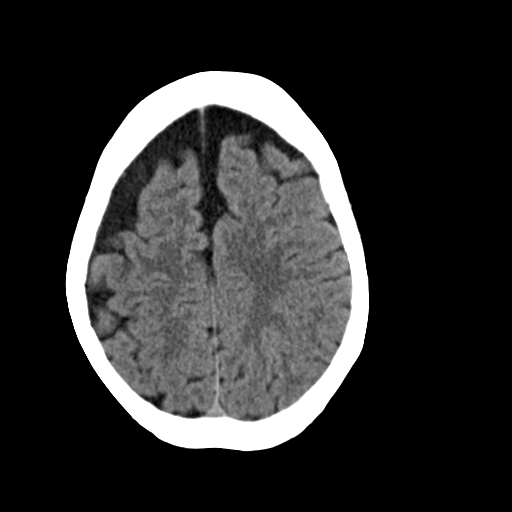
[im 23/32  brain]
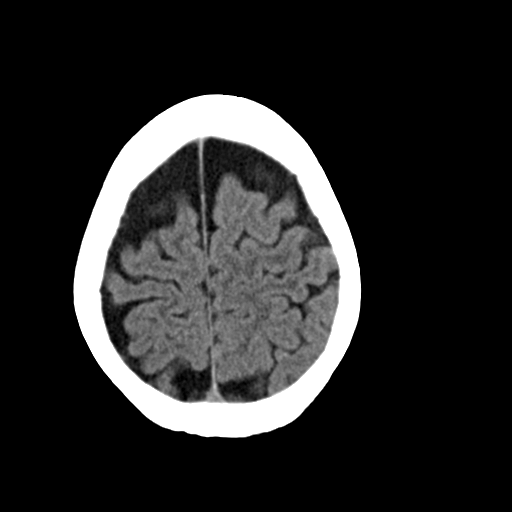
[im 24/32  brain]
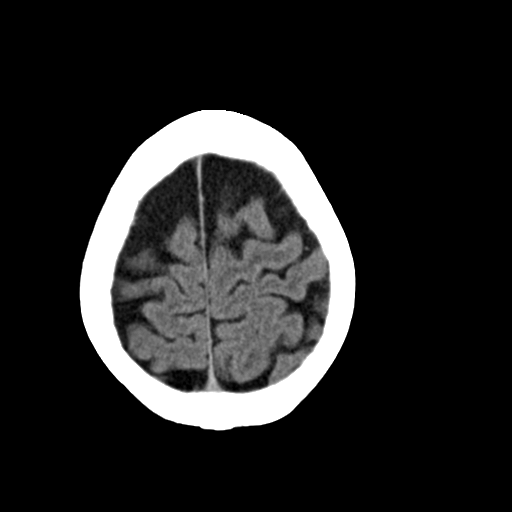
[im 24/32  bone]
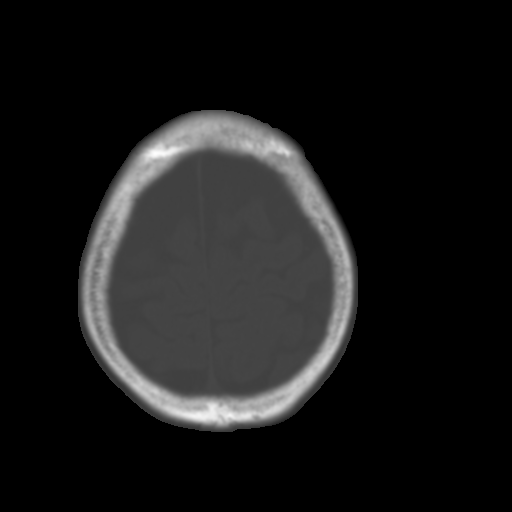
[im 26/32  brain]
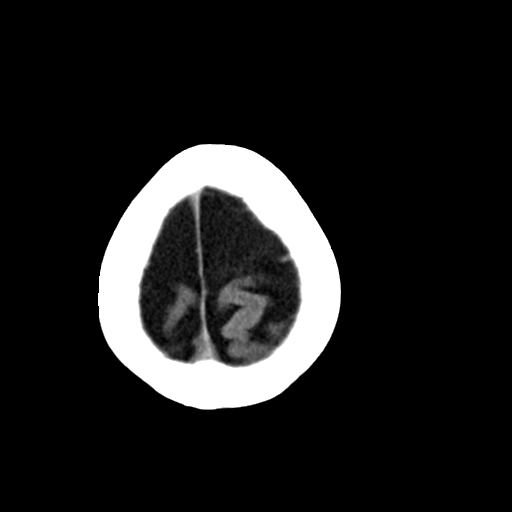
[im 28/32  brain]
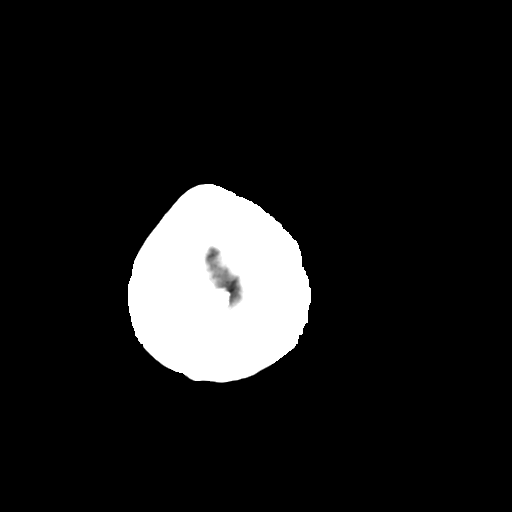
[im 30/32  brain]
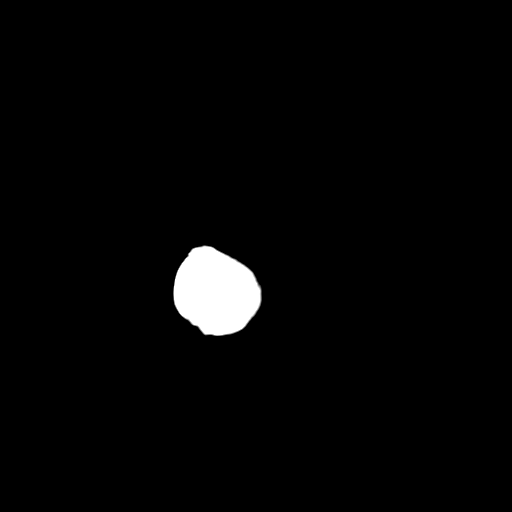

[16 of 30 positions shown; findings below may reference images not displayed]

FINDINGS: Brain: No evidence of acute infarction, hemorrhage, hydrocephalus,
extra-axial collection or mass lesion/mass effect.

Vascular: No hyperdense vessel or unexpected calcification.

Skull: Normal. Negative for fracture or focal lesion.

Sinuses/Orbits: No acute finding.

Other: None.
IMPRESSION: No acute intracranial abnormality.

## 2023-09-17 IMAGING — CR DG RIBS W/ CHEST 3+V*L*
3 series · 3 of 3 positions shown · non-contrast
Comparison: None.

CLINICAL DATA: Numbness and tingling in left arm. Patient feels
vibration in left posterior rib area.

EXAM:
LEFT RIBS AND CHEST - 3+ VIEW

[w chest pa]
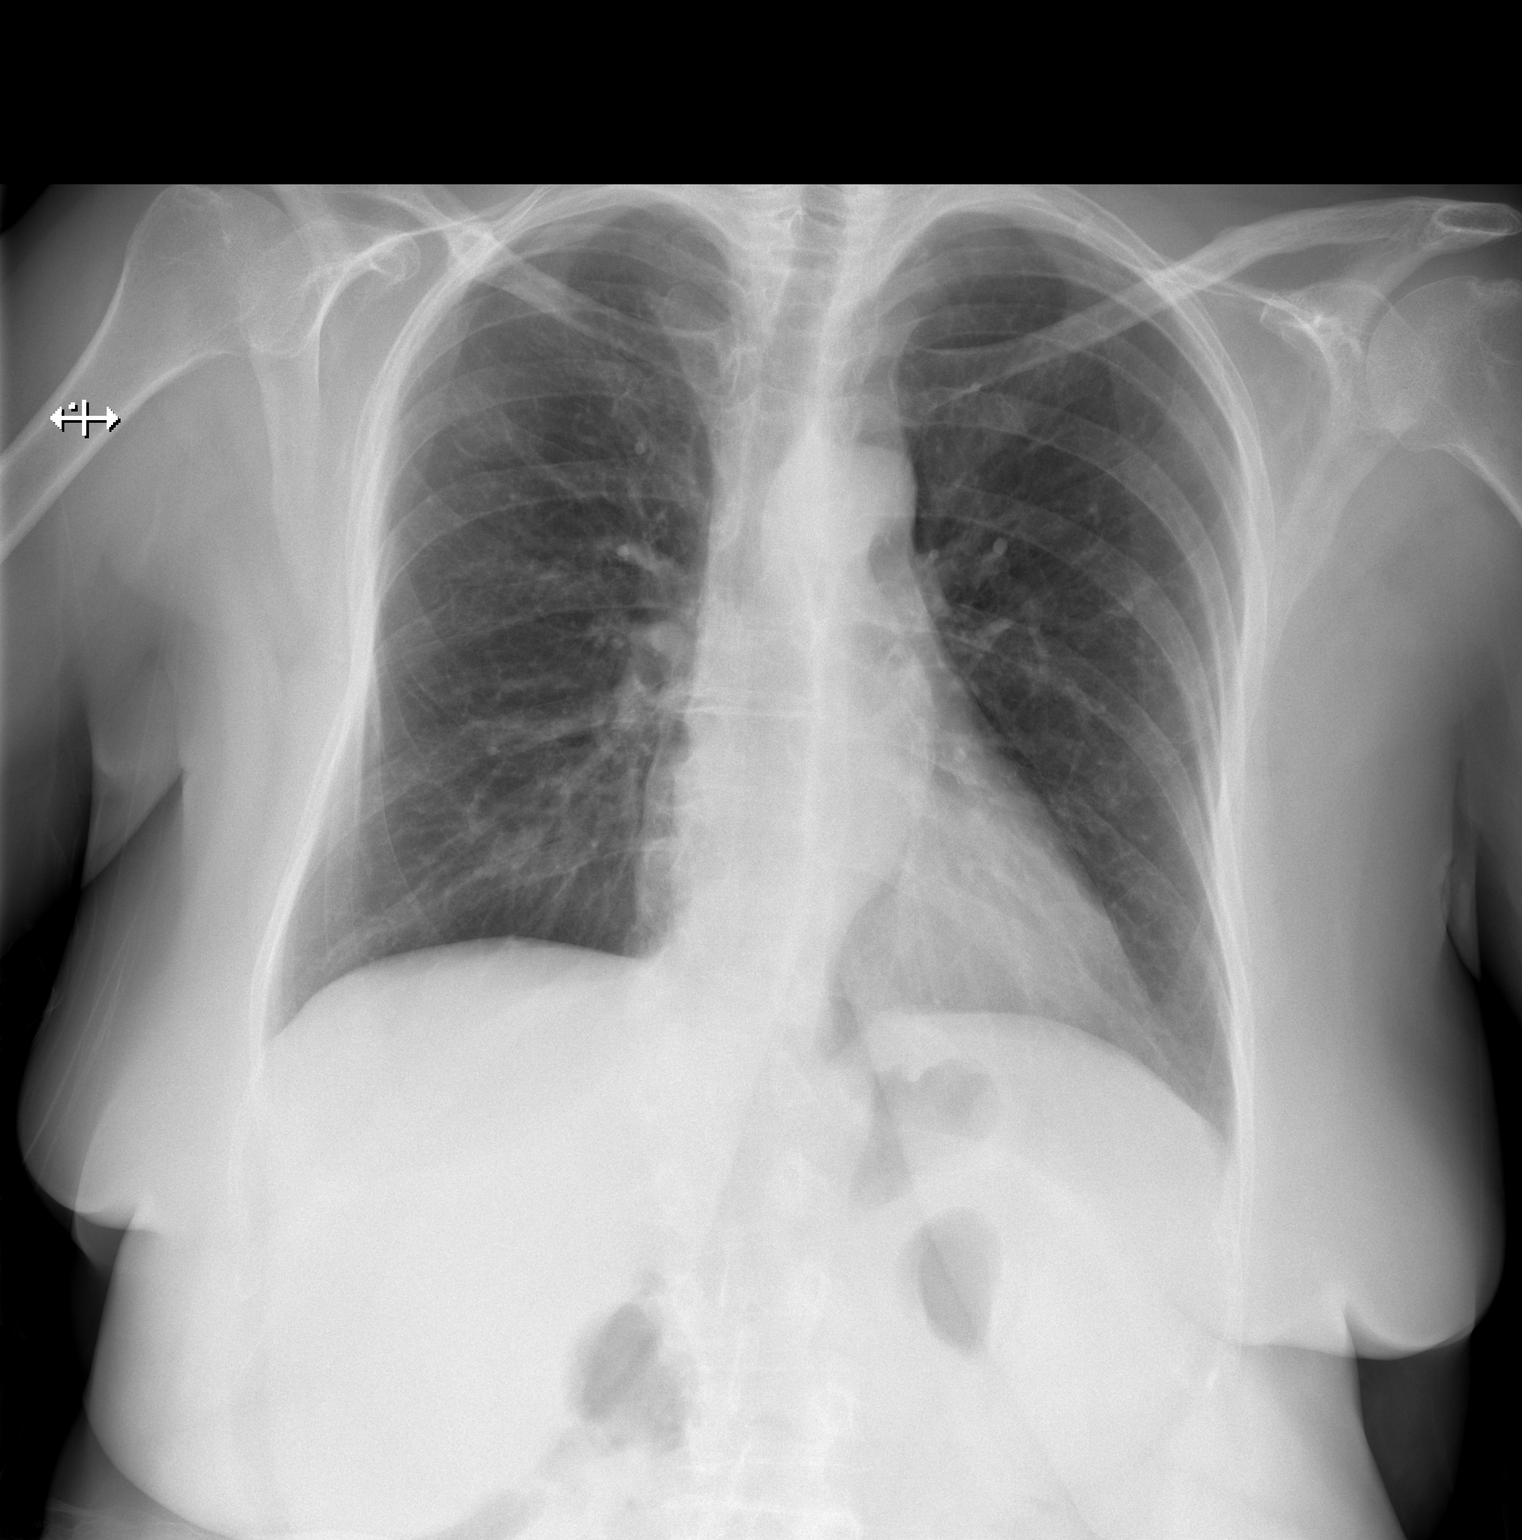

[w ribs ap lower left]
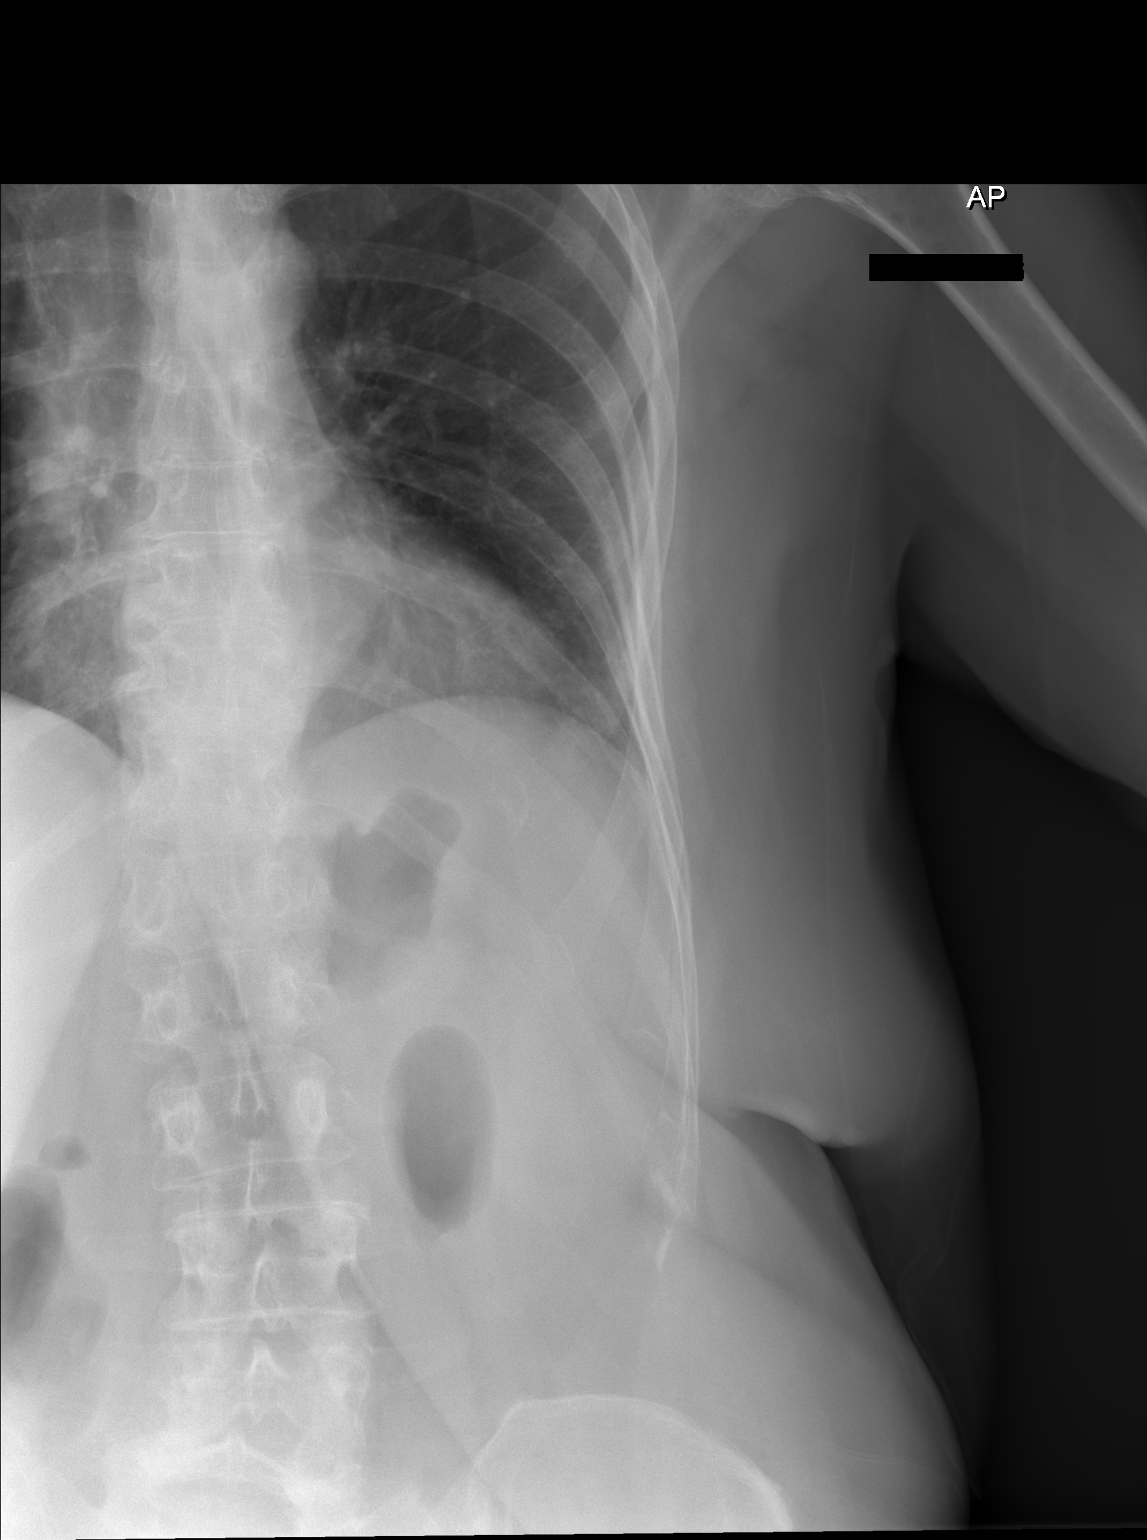

[w ribs obl left]
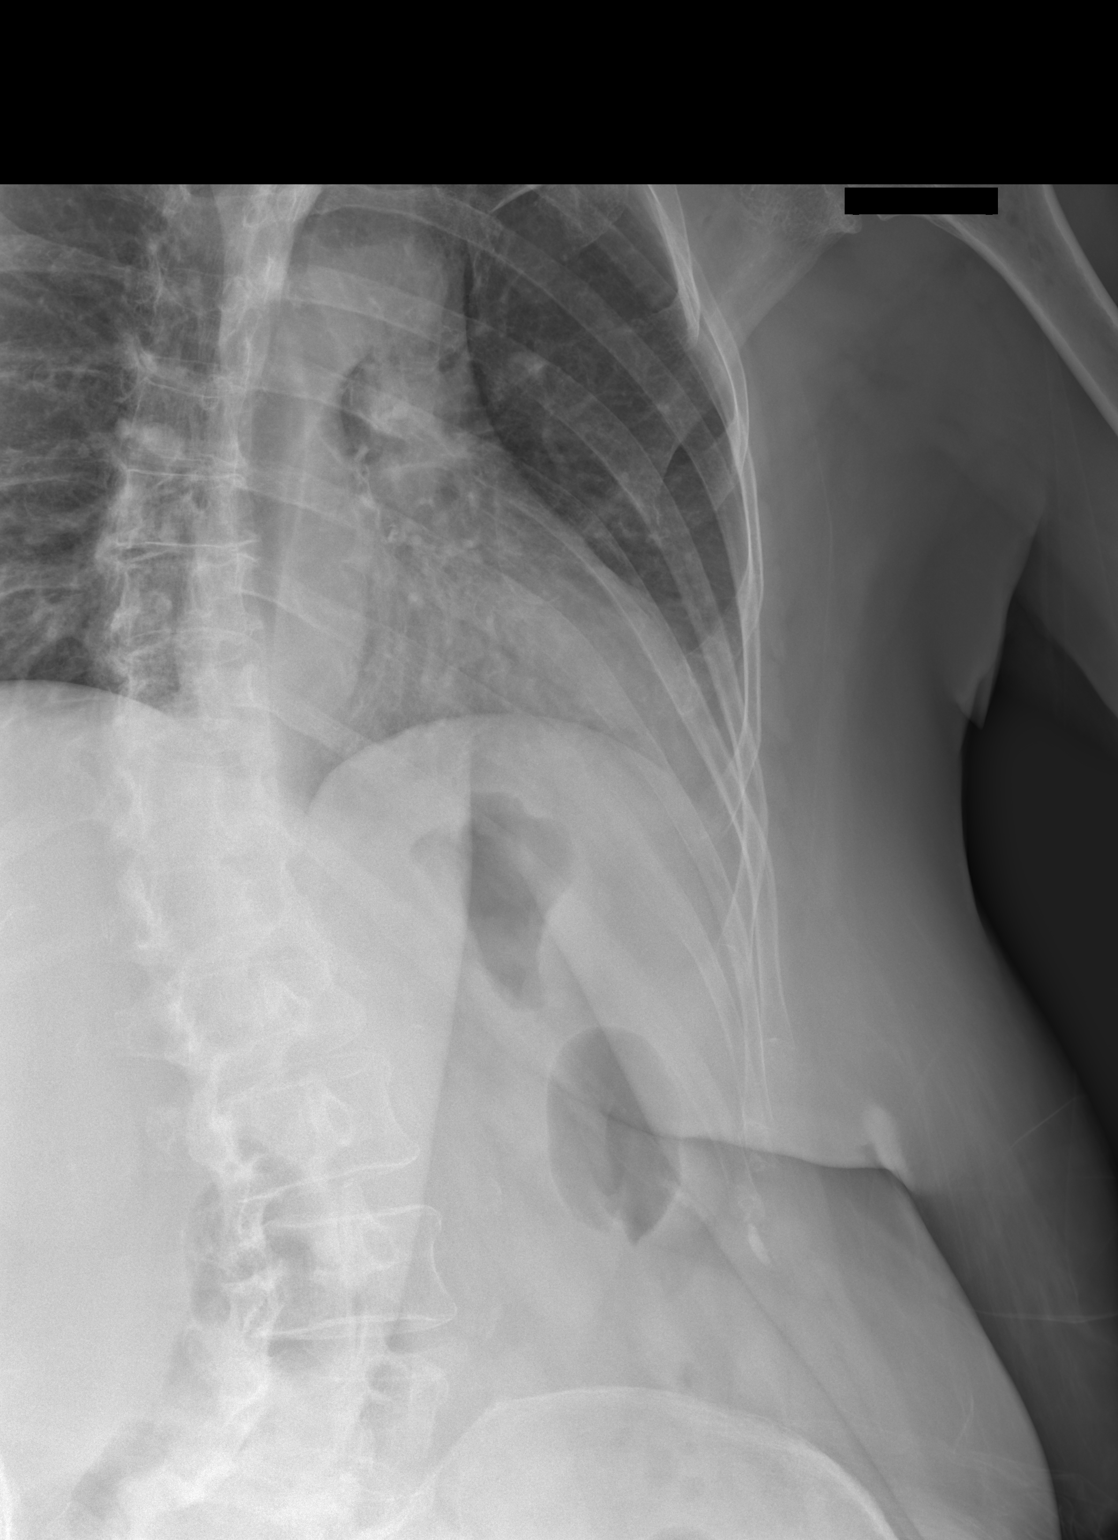

[3 of 3 positions shown; findings below may reference images not displayed]

FINDINGS: Both lungs are clear. Heart and mediastinum are within normal
limits. Negative for a pneumothorax. Mild curvature in the lumbar
spine. Left ribs are intact without a displaced fracture.
IMPRESSION: 1. No acute abnormality.
2. No evidence for a displaced left rib fracture.

## 2023-09-19 DIAGNOSIS — M5451 Vertebrogenic low back pain: Secondary | ICD-10-CM | POA: Diagnosis not present

## 2023-10-01 DIAGNOSIS — M4856XA Collapsed vertebra, not elsewhere classified, lumbar region, initial encounter for fracture: Secondary | ICD-10-CM | POA: Diagnosis not present

## 2023-10-01 DIAGNOSIS — M5416 Radiculopathy, lumbar region: Secondary | ICD-10-CM | POA: Diagnosis not present

## 2023-10-14 DIAGNOSIS — M5416 Radiculopathy, lumbar region: Secondary | ICD-10-CM | POA: Diagnosis not present

## 2023-10-29 DIAGNOSIS — M4856XA Collapsed vertebra, not elsewhere classified, lumbar region, initial encounter for fracture: Secondary | ICD-10-CM | POA: Diagnosis not present

## 2023-10-29 DIAGNOSIS — M5416 Radiculopathy, lumbar region: Secondary | ICD-10-CM | POA: Diagnosis not present

## 2023-12-03 DIAGNOSIS — N1831 Chronic kidney disease, stage 3a: Secondary | ICD-10-CM | POA: Diagnosis not present

## 2023-12-03 DIAGNOSIS — D72819 Decreased white blood cell count, unspecified: Secondary | ICD-10-CM | POA: Diagnosis not present

## 2023-12-03 DIAGNOSIS — M810 Age-related osteoporosis without current pathological fracture: Secondary | ICD-10-CM | POA: Diagnosis not present

## 2023-12-03 DIAGNOSIS — M1990 Unspecified osteoarthritis, unspecified site: Secondary | ICD-10-CM | POA: Diagnosis not present

## 2023-12-03 DIAGNOSIS — E782 Mixed hyperlipidemia: Secondary | ICD-10-CM | POA: Diagnosis not present

## 2023-12-03 DIAGNOSIS — E1122 Type 2 diabetes mellitus with diabetic chronic kidney disease: Secondary | ICD-10-CM | POA: Diagnosis not present

## 2023-12-03 DIAGNOSIS — I10 Essential (primary) hypertension: Secondary | ICD-10-CM | POA: Diagnosis not present

## 2023-12-03 DIAGNOSIS — G629 Polyneuropathy, unspecified: Secondary | ICD-10-CM | POA: Diagnosis not present

## 2023-12-10 DIAGNOSIS — E782 Mixed hyperlipidemia: Secondary | ICD-10-CM | POA: Diagnosis not present

## 2023-12-10 DIAGNOSIS — I10 Essential (primary) hypertension: Secondary | ICD-10-CM | POA: Diagnosis not present

## 2023-12-10 DIAGNOSIS — N1831 Chronic kidney disease, stage 3a: Secondary | ICD-10-CM | POA: Diagnosis not present

## 2023-12-10 DIAGNOSIS — E1165 Type 2 diabetes mellitus with hyperglycemia: Secondary | ICD-10-CM | POA: Diagnosis not present

## 2024-02-03 DIAGNOSIS — Z1330 Encounter for screening examination for mental health and behavioral disorders, unspecified: Secondary | ICD-10-CM | POA: Diagnosis not present

## 2024-02-03 DIAGNOSIS — H9193 Unspecified hearing loss, bilateral: Secondary | ICD-10-CM | POA: Diagnosis not present

## 2024-02-03 DIAGNOSIS — R200 Anesthesia of skin: Secondary | ICD-10-CM | POA: Diagnosis not present

## 2024-02-26 DIAGNOSIS — M25511 Pain in right shoulder: Secondary | ICD-10-CM | POA: Diagnosis not present

## 2024-03-22 DIAGNOSIS — M25511 Pain in right shoulder: Secondary | ICD-10-CM | POA: Diagnosis not present

## 2024-03-29 DIAGNOSIS — M25511 Pain in right shoulder: Secondary | ICD-10-CM | POA: Diagnosis not present

## 2024-04-06 DIAGNOSIS — M25511 Pain in right shoulder: Secondary | ICD-10-CM | POA: Diagnosis not present

## 2024-04-07 DIAGNOSIS — H6522 Chronic serous otitis media, left ear: Secondary | ICD-10-CM | POA: Diagnosis not present

## 2024-04-07 DIAGNOSIS — H90A32 Mixed conductive and sensorineural hearing loss, unilateral, left ear with restricted hearing on the contralateral side: Secondary | ICD-10-CM | POA: Diagnosis not present

## 2024-04-12 DIAGNOSIS — M25511 Pain in right shoulder: Secondary | ICD-10-CM | POA: Diagnosis not present

## 2024-04-14 DIAGNOSIS — E1165 Type 2 diabetes mellitus with hyperglycemia: Secondary | ICD-10-CM | POA: Diagnosis not present

## 2024-04-14 DIAGNOSIS — E782 Mixed hyperlipidemia: Secondary | ICD-10-CM | POA: Diagnosis not present

## 2024-04-14 DIAGNOSIS — I10 Essential (primary) hypertension: Secondary | ICD-10-CM | POA: Diagnosis not present

## 2024-04-14 DIAGNOSIS — N1831 Chronic kidney disease, stage 3a: Secondary | ICD-10-CM | POA: Diagnosis not present

## 2024-04-19 DIAGNOSIS — M25511 Pain in right shoulder: Secondary | ICD-10-CM | POA: Diagnosis not present

## 2024-04-21 DIAGNOSIS — M810 Age-related osteoporosis without current pathological fracture: Secondary | ICD-10-CM | POA: Diagnosis not present

## 2024-04-21 DIAGNOSIS — M1990 Unspecified osteoarthritis, unspecified site: Secondary | ICD-10-CM | POA: Diagnosis not present

## 2024-04-21 DIAGNOSIS — N1831 Chronic kidney disease, stage 3a: Secondary | ICD-10-CM | POA: Diagnosis not present

## 2024-04-21 DIAGNOSIS — E1122 Type 2 diabetes mellitus with diabetic chronic kidney disease: Secondary | ICD-10-CM | POA: Diagnosis not present

## 2024-04-21 DIAGNOSIS — G629 Polyneuropathy, unspecified: Secondary | ICD-10-CM | POA: Diagnosis not present

## 2024-04-21 DIAGNOSIS — I10 Essential (primary) hypertension: Secondary | ICD-10-CM | POA: Diagnosis not present

## 2024-04-21 DIAGNOSIS — D72819 Decreased white blood cell count, unspecified: Secondary | ICD-10-CM | POA: Diagnosis not present

## 2024-04-21 DIAGNOSIS — E782 Mixed hyperlipidemia: Secondary | ICD-10-CM | POA: Diagnosis not present

## 2024-04-26 DIAGNOSIS — M25511 Pain in right shoulder: Secondary | ICD-10-CM | POA: Diagnosis not present

## 2024-05-03 DIAGNOSIS — M25511 Pain in right shoulder: Secondary | ICD-10-CM | POA: Diagnosis not present

## 2024-05-10 DIAGNOSIS — M25511 Pain in right shoulder: Secondary | ICD-10-CM | POA: Diagnosis not present

## 2024-06-15 DIAGNOSIS — H903 Sensorineural hearing loss, bilateral: Secondary | ICD-10-CM | POA: Diagnosis not present

## 2024-06-15 DIAGNOSIS — Z9622 Myringotomy tube(s) status: Secondary | ICD-10-CM | POA: Diagnosis not present

## 2024-06-15 DIAGNOSIS — R200 Anesthesia of skin: Secondary | ICD-10-CM | POA: Diagnosis not present

## 2024-06-16 DIAGNOSIS — R200 Anesthesia of skin: Secondary | ICD-10-CM | POA: Diagnosis not present

## 2024-06-18 DIAGNOSIS — G549 Nerve root and plexus disorder, unspecified: Secondary | ICD-10-CM | POA: Diagnosis not present

## 2024-08-24 DIAGNOSIS — D72819 Decreased white blood cell count, unspecified: Secondary | ICD-10-CM | POA: Diagnosis not present

## 2024-08-24 DIAGNOSIS — I10 Essential (primary) hypertension: Secondary | ICD-10-CM | POA: Diagnosis not present

## 2024-08-24 DIAGNOSIS — E1122 Type 2 diabetes mellitus with diabetic chronic kidney disease: Secondary | ICD-10-CM | POA: Diagnosis not present

## 2024-08-24 DIAGNOSIS — N1831 Chronic kidney disease, stage 3a: Secondary | ICD-10-CM | POA: Diagnosis not present

## 2024-08-24 DIAGNOSIS — M810 Age-related osteoporosis without current pathological fracture: Secondary | ICD-10-CM | POA: Diagnosis not present

## 2024-08-24 DIAGNOSIS — R5383 Other fatigue: Secondary | ICD-10-CM | POA: Diagnosis not present

## 2024-08-24 DIAGNOSIS — M1990 Unspecified osteoarthritis, unspecified site: Secondary | ICD-10-CM | POA: Diagnosis not present

## 2024-08-24 DIAGNOSIS — G629 Polyneuropathy, unspecified: Secondary | ICD-10-CM | POA: Diagnosis not present

## 2024-08-24 DIAGNOSIS — E782 Mixed hyperlipidemia: Secondary | ICD-10-CM | POA: Diagnosis not present

## 2024-09-07 DIAGNOSIS — H5213 Myopia, bilateral: Secondary | ICD-10-CM | POA: Diagnosis not present
